# Patient Record
Sex: Female | Born: 1940 | Race: White | Hispanic: No | Smoking: Current every day smoker
Health system: Southern US, Community
[De-identification: ages and names within clinical notes are randomized; demographics above are authoritative.]

## PROBLEM LIST (undated history)

## (undated) DIAGNOSIS — F17200 Nicotine dependence, unspecified, uncomplicated: Secondary | ICD-10-CM

## (undated) DIAGNOSIS — K219 Gastro-esophageal reflux disease without esophagitis: Secondary | ICD-10-CM

## (undated) DIAGNOSIS — N182 Chronic kidney disease, stage 2 (mild): Secondary | ICD-10-CM

## (undated) DIAGNOSIS — R740 Nonspecific elevation of levels of transaminase and lactic acid dehydrogenase [LDH]: Secondary | ICD-10-CM

## (undated) DIAGNOSIS — E039 Hypothyroidism, unspecified: Secondary | ICD-10-CM

## (undated) DIAGNOSIS — Z8619 Personal history of other infectious and parasitic diseases: Secondary | ICD-10-CM

## (undated) DIAGNOSIS — J309 Allergic rhinitis, unspecified: Secondary | ICD-10-CM

## (undated) DIAGNOSIS — K760 Fatty (change of) liver, not elsewhere classified: Secondary | ICD-10-CM

## (undated) DIAGNOSIS — M810 Age-related osteoporosis without current pathological fracture: Secondary | ICD-10-CM

## (undated) DIAGNOSIS — F419 Anxiety disorder, unspecified: Secondary | ICD-10-CM

## (undated) DIAGNOSIS — J449 Chronic obstructive pulmonary disease, unspecified: Secondary | ICD-10-CM

## (undated) HISTORY — DX: Gastro-esophageal reflux disease without esophagitis: K21.9

## (undated) HISTORY — DX: Chronic obstructive pulmonary disease, unspecified: J44.9

## (undated) HISTORY — DX: Chronic kidney disease, stage 2 (mild): N18.2

## (undated) HISTORY — DX: Nicotine dependence, unspecified, uncomplicated: F17.200

## (undated) HISTORY — DX: Age-related osteoporosis without current pathological fracture: M81.0

## (undated) HISTORY — DX: Anxiety disorder, unspecified: F41.9

## (undated) HISTORY — DX: Nonspecific elevation of levels of transaminase and lactic acid dehydrogenase (ldh): R74.0

## (undated) HISTORY — DX: Allergic rhinitis, unspecified: J30.9

## (undated) HISTORY — DX: Hypothyroidism, unspecified: E03.9

## (undated) HISTORY — DX: Personal history of other infectious and parasitic diseases: Z86.19

---

## 1998-06-21 ENCOUNTER — Ambulatory Visit (HOSPITAL_COMMUNITY): Admission: RE | Admit: 1998-06-21 | Discharge: 1998-06-21 | Payer: Self-pay | Admitting: Internal Medicine

## 1998-06-21 ENCOUNTER — Encounter: Payer: Self-pay | Admitting: Internal Medicine

## 1999-12-20 ENCOUNTER — Emergency Department (HOSPITAL_COMMUNITY): Admission: EM | Admit: 1999-12-20 | Discharge: 1999-12-20 | Payer: Self-pay | Admitting: Emergency Medicine

## 2000-01-22 ENCOUNTER — Encounter: Payer: Self-pay | Admitting: Internal Medicine

## 2000-01-22 ENCOUNTER — Ambulatory Visit (HOSPITAL_COMMUNITY): Admission: RE | Admit: 2000-01-22 | Discharge: 2000-01-22 | Payer: Self-pay | Admitting: Internal Medicine

## 2005-10-01 ENCOUNTER — Other Ambulatory Visit: Admission: RE | Admit: 2005-10-01 | Discharge: 2005-10-01 | Payer: Self-pay | Admitting: Internal Medicine

## 2010-05-08 ENCOUNTER — Other Ambulatory Visit: Payer: Self-pay | Admitting: Internal Medicine

## 2010-05-08 DIAGNOSIS — R1011 Right upper quadrant pain: Secondary | ICD-10-CM

## 2010-05-12 ENCOUNTER — Ambulatory Visit
Admission: RE | Admit: 2010-05-12 | Discharge: 2010-05-12 | Disposition: A | Discharge status: Home / Self Care | Payer: Medicare Other | Source: Ambulatory Visit | Attending: Internal Medicine | Admitting: Internal Medicine

## 2010-05-12 DIAGNOSIS — R1011 Right upper quadrant pain: Secondary | ICD-10-CM

## 2011-10-13 DIAGNOSIS — M810 Age-related osteoporosis without current pathological fracture: Secondary | ICD-10-CM

## 2011-10-13 HISTORY — DX: Age-related osteoporosis without current pathological fracture: M81.0

## 2013-12-06 ENCOUNTER — Other Ambulatory Visit: Payer: Self-pay | Admitting: Internal Medicine

## 2013-12-06 DIAGNOSIS — R748 Abnormal levels of other serum enzymes: Secondary | ICD-10-CM

## 2013-12-06 DIAGNOSIS — R1010 Upper abdominal pain, unspecified: Secondary | ICD-10-CM

## 2013-12-15 ENCOUNTER — Ambulatory Visit
Admission: RE | Admit: 2013-12-15 | Discharge: 2013-12-15 | Disposition: A | Discharge status: Home / Self Care | Payer: Medicare Other | Source: Ambulatory Visit | Attending: Internal Medicine | Admitting: Internal Medicine

## 2013-12-15 ENCOUNTER — Encounter (INDEPENDENT_AMBULATORY_CARE_PROVIDER_SITE_OTHER): Payer: Self-pay

## 2013-12-15 DIAGNOSIS — R748 Abnormal levels of other serum enzymes: Secondary | ICD-10-CM

## 2013-12-15 DIAGNOSIS — R1010 Upper abdominal pain, unspecified: Secondary | ICD-10-CM

## 2014-03-26 DIAGNOSIS — J441 Chronic obstructive pulmonary disease with (acute) exacerbation: Secondary | ICD-10-CM | POA: Diagnosis not present

## 2014-03-26 DIAGNOSIS — Z720 Tobacco use: Secondary | ICD-10-CM | POA: Diagnosis not present

## 2014-03-26 DIAGNOSIS — J309 Allergic rhinitis, unspecified: Secondary | ICD-10-CM | POA: Diagnosis not present

## 2015-02-13 DIAGNOSIS — R7401 Elevation of levels of liver transaminase levels: Secondary | ICD-10-CM

## 2015-02-13 HISTORY — DX: Elevation of levels of liver transaminase levels: R74.01

## 2015-02-22 ENCOUNTER — Encounter: Payer: Self-pay | Admitting: Family Medicine

## 2015-02-22 ENCOUNTER — Ambulatory Visit (INDEPENDENT_AMBULATORY_CARE_PROVIDER_SITE_OTHER): Payer: Medicare Other | Admitting: Family Medicine

## 2015-02-22 VITALS — BP 99/65 | HR 95 | Temp 97.4°F | Resp 16 | Ht 63.0 in | Wt 104.5 lb

## 2015-02-22 DIAGNOSIS — Z23 Encounter for immunization: Secondary | ICD-10-CM | POA: Diagnosis not present

## 2015-02-22 DIAGNOSIS — H9193 Unspecified hearing loss, bilateral: Secondary | ICD-10-CM

## 2015-02-22 DIAGNOSIS — F17200 Nicotine dependence, unspecified, uncomplicated: Secondary | ICD-10-CM

## 2015-02-22 DIAGNOSIS — J438 Other emphysema: Secondary | ICD-10-CM

## 2015-02-22 DIAGNOSIS — E039 Hypothyroidism, unspecified: Secondary | ICD-10-CM

## 2015-02-22 DIAGNOSIS — H9313 Tinnitus, bilateral: Secondary | ICD-10-CM | POA: Diagnosis not present

## 2015-02-22 DIAGNOSIS — Z0000 Encounter for general adult medical examination without abnormal findings: Secondary | ICD-10-CM

## 2015-02-22 LAB — COMPREHENSIVE METABOLIC PANEL
ALBUMIN: 4.3 g/dL (ref 3.5–5.2)
ALK PHOS: 264 U/L — AB (ref 39–117)
ALT: 53 U/L — AB (ref 0–35)
AST: 54 U/L — AB (ref 0–37)
BUN: 23 mg/dL (ref 6–23)
CHLORIDE: 104 meq/L (ref 96–112)
CO2: 26 mEq/L (ref 19–32)
CREATININE: 0.85 mg/dL (ref 0.40–1.20)
Calcium: 10.9 mg/dL — ABNORMAL HIGH (ref 8.4–10.5)
GFR: 69.44 mL/min (ref >60.00)
Glucose, Bld: 91 mg/dL (ref 70–99)
Potassium: 5.6 mEq/L — ABNORMAL HIGH (ref 3.5–5.1)
SODIUM: 138 meq/L (ref 135–145)
TOTAL PROTEIN: 7.7 g/dL (ref 6.0–8.3)
Total Bilirubin: 0.8 mg/dL (ref 0.2–1.2)

## 2015-02-22 LAB — TSH: TSH: 2.48 u[IU]/mL (ref 0.35–4.50)

## 2015-02-22 NOTE — Progress Notes (Signed)
Office Note 02/23/2015  CC:  Chief Complaint  Patient presents with  . Establish Care    Pt is not fasting.    HPI:  Rebecca Mckee is a 75 y.o. White female who is here to establish care. Patient's most recent primary MD: [facility] (Dr. Ricki Miller). Old records were not reviewed prior to or during today's visit.  Most recent MD f/u was spring 2016--"my allergies and a little pneumonia". Last labs unknown but she knows she is overdue for thyroid monitoring.   She takes levothyroxine correctly every day.  C/o long hx (about 4 yrs) of intermittent ringing in ears and hearing loss bilat.  Worse the last week or so.  No hx of excessive noise exposure.   Has never had audiology evaluation per her report.  No old records available for review today.  Has never had a colonoscopy and says she does not want one.  Says she has not been getting pap/pelvics b/c she has chosen to decline these.  Past Medical History  Diagnosis Date  . Allergic rhinitis   . Anxiety   . Hypothyroidism   . Tobacco dependence     History reviewed. No pertinent past surgical history.  Family History  Problem Relation Age of Onset  . Lung cancer Mother     smoker  . Lung cancer Father   . Breast cancer Maternal Grandmother   . Cancer Maternal Grandmother   . Congestive Heart Failure Paternal Grandmother   . Heart disease Paternal Grandfather     Social History   Social History  . Marital Status: Divorced    Spouse Name: N/A  . Number of Children: N/A  . Years of Education: N/A   Occupational History  . Not on file.   Social History Main Topics  . Smoking status: Current Every Day Smoker -- 0.25 packs/day for 60 years    Types: Cigarettes  . Smokeless tobacco: Never Used  . Alcohol Use: Yes  . Drug Use: No  . Sexual Activity: Not on file   Other Topics Concern  . Not on file   Social History Narrative   Widow, one son.   Lives with sister in [place].   Occupation: retired Holiday representative.   Tobacco: 60 pack-yr hx, ongoing as of 02/22/15.   Rare alcohol.       Outpatient Encounter Prescriptions as of 02/22/2015  Medication Sig  . SYNTHROID 50 MCG tablet Take 1 tablet by mouth daily.   No facility-administered encounter medications on file as of 02/22/2015.  ProAir HFA: she says she doesn't use it "very much".  Not on File  ROS Review of Systems  Constitutional: Negative for fever and fatigue.  HENT: Negative for congestion and sore throat.   Eyes: Negative for visual disturbance.  Respiratory: Negative for cough.   Cardiovascular: Negative for chest pain.  Gastrointestinal: Negative for nausea and abdominal pain.  Genitourinary: Negative for dysuria.  Musculoskeletal: Negative for back pain and joint swelling.  Skin: Negative for rash.  Neurological: Negative for weakness and headaches.  Hematological: Negative for adenopathy.    PE; Blood pressure 99/65, pulse 95, temperature 97.4 F (36.3 C), temperature source Oral, resp. rate 16, height  (1.6 m), weight 104 lb 8 oz (47.401 kg), SpO2 92 %. Gen: Alert, well appearing.  Patient is oriented to person, place, time, and situation. ENT: Ears: EACs clear, normal epithelium.  TMs with good light reflex and landmarks bilaterally.  Eyes: no injection, icteris, swelling,  or exudate.  EOMI, PERRLA. Nose: no drainage or turbinate edema/swelling.  No injection or focal lesion.  Mouth: lips without lesion/swelling.  Oral mucosa pink and moist.  Dentition intact and without obvious caries or gingival swelling.  Oropharynx without erythema, exudate, or swelling.  Neck - No masses or thyromegaly or limitation in range of motion CV: RRR, no m/r/g.   LUNGS: CTA bilat, nonlabored resps, good aeration in all lung fields.  Mild prolongation of expiratory phase. EXT: no clubbing, cyanosis, or edema.   Pertinent labs:  None today  ASSESSMENT AND PLAN:   New pt; obtain old  records.  1) Hypothyroidism: stable per pt history given today. Due for TSH monitoring--drawn today.  2) COPD: she seems unlimited by this since she is largely sedentary.  For now the plan is to simply continue with prn albuterol HFA use--which pt says is infrequent. Ongoing tobacco abuse: encouraged complete cessation but she is not contemplating this currently.  3) Hearing impairment AU, chronic, with tinnitus: refer to audiology for detailed evaluation.  4) Prev health care: flu vaccine today. We'll await old records to verify other vaccines' status.  An After Visit Summary was printed and given to the patient.  Return in about 6 months (around 08/22/2015) for routine chronic illness f/u.

## 2015-02-22 NOTE — Progress Notes (Signed)
Pre visit review using our clinic review tool, if applicable. No additional management support is needed unless otherwise documented below in the visit note. 

## 2015-02-23 ENCOUNTER — Encounter: Payer: Self-pay | Admitting: Family Medicine

## 2015-02-25 ENCOUNTER — Other Ambulatory Visit: Payer: Medicare Other

## 2015-02-25 DIAGNOSIS — R7401 Elevation of levels of liver transaminase levels: Secondary | ICD-10-CM

## 2015-02-25 DIAGNOSIS — R740 Nonspecific elevation of levels of transaminase and lactic acid dehydrogenase [LDH]: Principal | ICD-10-CM

## 2015-02-26 ENCOUNTER — Other Ambulatory Visit: Payer: Self-pay | Admitting: Family Medicine

## 2015-02-26 ENCOUNTER — Other Ambulatory Visit: Payer: Self-pay | Admitting: *Deleted

## 2015-02-26 ENCOUNTER — Encounter: Payer: Self-pay | Admitting: Family Medicine

## 2015-02-26 DIAGNOSIS — R7401 Elevation of levels of liver transaminase levels: Secondary | ICD-10-CM

## 2015-02-26 DIAGNOSIS — Z13220 Encounter for screening for lipoid disorders: Secondary | ICD-10-CM

## 2015-02-26 DIAGNOSIS — R748 Abnormal levels of other serum enzymes: Secondary | ICD-10-CM

## 2015-02-26 DIAGNOSIS — R740 Nonspecific elevation of levels of transaminase and lactic acid dehydrogenase [LDH]: Principal | ICD-10-CM

## 2015-02-26 LAB — HEPATITIS B SURFACE ANTIGEN: HEP B S AG: NEGATIVE

## 2015-02-26 LAB — HEPATITIS C ANTIBODY: HCV AB: NEGATIVE

## 2015-03-04 ENCOUNTER — Ambulatory Visit (HOSPITAL_COMMUNITY): Payer: Medicare Other

## 2015-03-11 ENCOUNTER — Ambulatory Visit
Admission: RE | Admit: 2015-03-11 | Discharge: 2015-03-11 | Disposition: A | Discharge status: Home / Self Care | Payer: Medicare Other | Source: Ambulatory Visit | Attending: Family Medicine | Admitting: Family Medicine

## 2015-03-11 DIAGNOSIS — R740 Nonspecific elevation of levels of transaminase and lactic acid dehydrogenase [LDH]: Principal | ICD-10-CM

## 2015-03-11 DIAGNOSIS — R748 Abnormal levels of other serum enzymes: Secondary | ICD-10-CM

## 2015-03-11 DIAGNOSIS — R7401 Elevation of levels of liver transaminase levels: Secondary | ICD-10-CM

## 2015-03-11 HISTORY — DX: Fatty (change of) liver, not elsewhere classified: K76.0

## 2015-03-18 ENCOUNTER — Ambulatory Visit (INDEPENDENT_AMBULATORY_CARE_PROVIDER_SITE_OTHER): Payer: Medicare Other | Admitting: Family Medicine

## 2015-03-18 ENCOUNTER — Ambulatory Visit (HOSPITAL_BASED_OUTPATIENT_CLINIC_OR_DEPARTMENT_OTHER)
Admission: RE | Admit: 2015-03-18 | Discharge: 2015-03-18 | Disposition: A | Discharge status: Home / Self Care | Payer: Medicare Other | Source: Ambulatory Visit | Attending: Family Medicine | Admitting: Family Medicine

## 2015-03-18 ENCOUNTER — Encounter: Payer: Self-pay | Admitting: Family Medicine

## 2015-03-18 VITALS — BP 104/71 | HR 104 | Temp 97.8°F | Resp 20 | Wt 99.0 lb

## 2015-03-18 DIAGNOSIS — J209 Acute bronchitis, unspecified: Secondary | ICD-10-CM

## 2015-03-18 DIAGNOSIS — J449 Chronic obstructive pulmonary disease, unspecified: Secondary | ICD-10-CM | POA: Diagnosis not present

## 2015-03-18 DIAGNOSIS — R509 Fever, unspecified: Secondary | ICD-10-CM | POA: Diagnosis present

## 2015-03-18 MED ORDER — DOXYCYCLINE HYCLATE 100 MG PO TABS: 100.0000 mg | ORAL_TABLET | Freq: Two times a day (BID) | ORAL | Status: DC

## 2015-03-18 MED ORDER — PREDNISONE 50 MG PO TABS: ORAL_TABLET | ORAL | Status: DC

## 2015-03-18 NOTE — Patient Instructions (Signed)
[facility]--> CXR [address] (right off of [address]) after chop house before pallidium.  Rest and hydrate Floanse. Mucinex pick up over the counter.  Doxycyline and Prednisone called in to pharmacy.    Acute Bronchitis Bronchitis is inflammation of the airways that extend from the windpipe into the lungs (bronchi). The inflammation often causes mucus to develop. This leads to a cough, which is the most common symptom of bronchitis.  In acute bronchitis, the condition usually develops suddenly and goes away over time, usually in a couple weeks. Smoking, allergies, and asthma can make bronchitis worse. Repeated episodes of bronchitis may cause further lung problems.  CAUSES Acute bronchitis is most often caused by the same virus that causes a cold. The virus can spread from person to person (contagious) through coughing, sneezing, and touching contaminated objects. SIGNS AND SYMPTOMS   Cough.   Fever.   Coughing up mucus.   Body aches.   Chest congestion.   Chills.   Shortness of breath.   Sore throat.  DIAGNOSIS  Acute bronchitis is usually diagnosed through a physical exam. Your health care provider will also ask you questions about your medical history. Tests, such as chest X-rays, are sometimes done to rule out other conditions.  TREATMENT  Acute bronchitis usually goes away in a couple weeks. Oftentimes, no medical treatment is necessary. Medicines are sometimes given for relief of fever or cough. Antibiotic medicines are usually not needed but may be prescribed in certain situations. In some cases, an inhaler may be recommended to help reduce shortness of breath and control the cough. A cool mist vaporizer may also be used to help thin bronchial secretions and make it easier to clear the chest.  HOME CARE INSTRUCTIONS  Get plenty of rest.   Drink enough fluids to keep your urine clear or pale yellow (unless you have a medical condition that requires fluid  restriction). Increasing fluids may help thin your respiratory secretions (sputum) and reduce chest congestion, and it will prevent dehydration.   Take medicines only as directed by your health care provider.  If you were prescribed an antibiotic medicine, finish it all even if you start to feel better.  Avoid smoking and secondhand smoke. Exposure to cigarette smoke or irritating chemicals will make bronchitis worse. If you are a smoker, consider using nicotine gum or skin patches to help control withdrawal symptoms. Quitting smoking will help your lungs heal faster.   Reduce the chances of another bout of acute bronchitis by washing your hands frequently, avoiding people with cold symptoms, and trying not to touch your hands to your mouth, nose, or eyes.   Keep all follow-up visits as directed by your health care provider.  SEEK MEDICAL CARE IF: Your symptoms do not improve after 1 week of treatment.  SEEK IMMEDIATE MEDICAL CARE IF:  You develop an increased fever or chills.   You have chest pain.   You have severe shortness of breath.  You have bloody sputum.   You develop dehydration.  You faint or repeatedly feel like you are going to pass out.  You develop repeated vomiting.  You develop a severe headache. MAKE SURE YOU:   Understand these instructions.  Will watch your condition.  Will get help right away if you are not doing well or get worse.   This information is not intended to replace advice given to you by your health care provider. Make sure you discuss any questions you have with your health  care provider.   Document Released: 02/06/2004 Document Revised: 01/19/2014 Document Reviewed: 06/21/2012 Elsevier Interactive Patient Education [employer].

## 2015-03-18 NOTE — Progress Notes (Signed)
Patient ID: Rebecca Mckee, female   DOB: 1941/01/09, 75 y.o.   MRN: [id number]    Rebecca Mckee , 09-01-1940, 75 y.o., female MRN: [id number]  CC: Dizziness Subjective: Pt presents for an acute OV with complaints of dizziness of 5 days duration. Associated symptoms include dizziness, nausea, low grade fever,chills, fatigue, PND, cough.  Pt has tried sudafed (last dose yesterday), mucinex x2,  to ease their symptoms. Pt has been recently diagnose with Fatty liver and elevated LFT, negtaive hepatits panel and [place] confirmed.  Normal GFR >60.  Smoker/COPD--> has albuterol. Likely needs PFT.  Allergic to PCN. No vomit, diarrhea, abdominal pain.  Allergies  Allergen Reactions  . Penicillins Other (See Comments)    Childhood unknown reaction   Social History  Substance Use Topics  . Smoking status: Current Every Day Smoker -- 0.25 packs/day for 60 years    Types: Cigarettes  . Smokeless tobacco: Never Used  . Alcohol Use: Yes   Past Medical History  Diagnosis Date  . Allergic rhinitis   . Anxiety   . Hypothyroidism   . Tobacco dependence     ongoing as of 02/2015  . COPD (chronic obstructive pulmonary disease) ([place])   . Elevated transaminase level 02/2015    + elevated alk phos.  Hep B and Hep C screening neg.  . Fatty liver     MILD elevation of LFTs 02/2015    History reviewed. No pertinent past surgical history. Family History  Problem Relation Age of Onset  . Lung cancer Mother     smoker  . Lung cancer Father   . Breast cancer Maternal Grandmother   . Cancer Maternal Grandmother   . Congestive Heart Failure Paternal Grandmother   . Heart disease Paternal Grandfather      Medication List       This list is accurate as of: 03/18/15  4:00 PM.  Always use your most recent med list.               SYNTHROID 50 MCG tablet  Generic drug:  levothyroxine  Take 1 tablet by mouth daily.         ROS: Negative, with the exception of above mentioned in  HPI   Objective:  BP 104/71 mmHg  Pulse 104  Temp(Src) 97.8 F (36.6 C)  Resp 20  Wt 99 lb (44.906 kg)  SpO2 93% Body mass index is 17.54 kg/(m^2). Gen: Afebrile. No acute distress. Nontoxic in appearance. Very thin female.  HENT: AT. . Bilateral TM visualized and normal in appearance. MMM, no oral lesions. Bilateral nares with erythema and swelling. Throat without erythema or exudates. Cough and hoarseness on exam.  Eyes:Pupils Equal Round Reactive to light, Extraocular movements intact,  Conjunctiva without redness, discharge or icterus. Neck/lymp/endocrine: Supple, no lymphadenopathy CV: mild tachycardia. No murmur appreciated.  Chest: CTAB, no wheeze or crackles. diminshed breath sounds bilateral. normal resp effort.  Abd: Soft. NTND. BS present  Skin: No  rashes, purpura or petechiae.  Neuro: Normal gait. PERLA. EOMi. Alert. Oriented x3    Assessment/Plan: Hisayo Delossantos is a 75 y.o. female present for acute OV for  1. Acute bronchitis, unspecified organism - doxycycline (VIBRA-TABS) 100 MG tablet; Take 1 tablet (100 mg total) by mouth 2 (two) times daily.  Dispense: 20 tablet; Refill: 0 - predniSONE (DELTASONE) 50 MG tablet; 50 mg QD.  Dispense: 5 tablet; Refill: 0 - DG Chest 2 View; Future - [facility]--> CXR - Rest and hydrate - Floanse. Mucinex, Doxycyline,  Prednisone  - F/U depending on cxr  electronically signed by:  Howard Pouch, DO  [place]

## 2015-03-19 ENCOUNTER — Telehealth: Payer: Self-pay | Admitting: Family Medicine

## 2015-03-19 DIAGNOSIS — IMO0001 Reserved for inherently not codable concepts without codable children: Secondary | ICD-10-CM

## 2015-03-19 NOTE — Telephone Encounter (Signed)
Please call pt: - Her xray is negative for pneumonia. It did show some changes consistent with COPD. I have placed a pulmonology referral for her to set up once acute illness is resolved.

## 2015-03-19 NOTE — Telephone Encounter (Signed)
Spoke with patient sister reviewed results and information.

## 2015-03-19 NOTE — Telephone Encounter (Signed)
Left message for patient to return call to review results ann information.

## 2015-03-20 ENCOUNTER — Telehealth: Payer: Self-pay | Admitting: *Deleted

## 2015-03-20 MED ORDER — AZITHROMYCIN 250 MG PO TABS: ORAL_TABLET | ORAL | Status: DC

## 2015-03-20 NOTE — Telephone Encounter (Signed)
Patient called states she had reaction to Doxycycline states she turned red, itchy and difficulty swallowing. Patient instructed to discontinue Doxycycline. Ok to take Benadryl as needed for itching. Patient states symptoms resolved at this time. Added doxycycline to patient allergy list. Per Dr Claiborne BillingsKuneff Zpak called in for patient. Patient instructed if Sx reoccur go to ER. Patient verbalized understanding.

## 2015-04-02 ENCOUNTER — Encounter: Payer: Self-pay | Admitting: Family Medicine

## 2015-04-18 ENCOUNTER — Encounter (INDEPENDENT_AMBULATORY_CARE_PROVIDER_SITE_OTHER): Payer: Self-pay

## 2015-04-18 ENCOUNTER — Ambulatory Visit (INDEPENDENT_AMBULATORY_CARE_PROVIDER_SITE_OTHER): Payer: Medicare Other | Admitting: Pulmonary Disease

## 2015-04-18 ENCOUNTER — Encounter: Payer: Self-pay | Admitting: Pulmonary Disease

## 2015-04-18 VITALS — BP 126/84 | HR 115 | Ht 64.0 in | Wt 98.0 lb

## 2015-04-18 DIAGNOSIS — J439 Emphysema, unspecified: Secondary | ICD-10-CM

## 2015-04-18 NOTE — Patient Instructions (Signed)
We will give you a sample of Bevespi  be used twice a day.  We will schedule you for PFTs  Return to clinic in 1 month

## 2015-04-18 NOTE — Progress Notes (Signed)
   Subjective:    Patient ID: Rebecca Mckee, female    DOB: 09/10/1940, 75 y.o.   MRN: [id number]  HPI Consult for evaluation of COPD.  Mr. Viviana Simplerissillos is a 75 year old with past medical history of hypothyroidism. She was evaluated last month for weakness, dyspnea on exertion, wheezing. She had a chest x-ray didn't which did not show any acute infiltrate but had hyperinflation consistent with COPD. She was diagnosed with bronchitis, COPD exacerbation and was given a course of prednisone and antibiotic. She feels improved although she still has dyspnea on exertion. She has albuterol rescue inhaler that he she uses sparingly. She does not notice any improvement with this.  DATA: CXR 03/18/15 Imaging reviewed. Hyperinflation consistent with COPD.                  Social History: 5 cigarettes per day smoker for nearly 60 years. She continues to smoke. No alcohol, drug use. 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   Family History:     Father-lung cancer Mother-lung cancer                                                                                                                                                        Review of Systems Has dyspnea exertion, cough with mucus production. Denies any wheezing, hemoptysis. Denies any chest pain, palpitation. Denies any nausea, vomiting, diarrhea, constipation. Denies any fevers, chills, loss of weight, loss of appetite. All other review of systems are negative.    Objective:   Physical Exam Blood pressure 126/84, pulse 115, height 5\' 4"  (1.626 m), weight 98 lb (44.453 kg), SpO2 95 %. Gen: No apparent distress Neuro: No gross focal deficits. HEENT: No JVD, lymphadenopathy, thyromegaly. RS: Clear, No wheeze or crackles CVS: S1-S2 heard, no murmurs rubs gallops. Abdomen: Soft, positive bowel sounds. Musculoskeletal: No edema.    Assessment & Plan:  Dyspnea Active smoker. She likely has COPD based on her smoking history and  imaging which shows hyperinflation but she seems a little resistant to accept that diagnosis. She is just on albuterol rescue inhaler. I'll start her on Bevespi  for relief of her dyspnea on exertion and send her for pulmonary function tests to evaluate the degree of obstruction.   She is not interested in quitting just yet. She may be a candidate for lung cancer screening. I will address this at next visit.  Plan: - PFTs - Start Bevespi, continue albuterol rescue inhaler.  Chilton GreathousePraveen Trevonte Ashkar MD [facility] Pager [phone] If no answer or after 3pm call: [phone] 04/18/2015, 3:10 PM

## 2015-05-08 ENCOUNTER — Encounter: Payer: Self-pay | Admitting: Family Medicine

## 2015-05-16 ENCOUNTER — Encounter (HOSPITAL_COMMUNITY): Payer: Medicare Other

## 2015-05-16 ENCOUNTER — Ambulatory Visit: Payer: Medicare Other | Admitting: Pulmonary Disease

## 2015-05-20 ENCOUNTER — Encounter: Payer: Self-pay | Admitting: Family Medicine

## 2015-08-22 ENCOUNTER — Ambulatory Visit: Payer: Self-pay | Admitting: Family Medicine

## 2015-08-22 DIAGNOSIS — Z0289 Encounter for other administrative examinations: Secondary | ICD-10-CM

## 2015-09-09 ENCOUNTER — Telehealth: Payer: Self-pay | Admitting: Family Medicine

## 2015-09-09 NOTE — Telephone Encounter (Signed)
Please advise 

## 2015-09-09 NOTE — Telephone Encounter (Signed)
Patient has made an appt for tomorrow but wants to know if there is anything she can do tonight to help her sleep. She is up all night, thinks maybe her thyroid is "out of wack".

## 2015-09-09 NOTE — Telephone Encounter (Signed)
Tried contacting patient.  No answer.  No VM to leave message.

## 2015-09-09 NOTE — Telephone Encounter (Signed)
Benadryl 25-50 mg about 1 hour prior to bedtime.

## 2015-09-10 ENCOUNTER — Encounter: Payer: Self-pay | Admitting: Family Medicine

## 2015-09-10 ENCOUNTER — Ambulatory Visit (INDEPENDENT_AMBULATORY_CARE_PROVIDER_SITE_OTHER): Payer: Medicare Other | Admitting: Family Medicine

## 2015-09-10 VITALS — BP 108/70 | HR 95 | Temp 97.7°F | Resp 16 | Ht 63.0 in | Wt 100.8 lb

## 2015-09-10 DIAGNOSIS — Z8639 Personal history of other endocrine, nutritional and metabolic disease: Secondary | ICD-10-CM

## 2015-09-10 DIAGNOSIS — G4700 Insomnia, unspecified: Secondary | ICD-10-CM | POA: Diagnosis not present

## 2015-09-10 DIAGNOSIS — E039 Hypothyroidism, unspecified: Secondary | ICD-10-CM | POA: Diagnosis not present

## 2015-09-10 DIAGNOSIS — K760 Fatty (change of) liver, not elsewhere classified: Secondary | ICD-10-CM

## 2015-09-10 DIAGNOSIS — L659 Nonscarring hair loss, unspecified: Secondary | ICD-10-CM | POA: Diagnosis not present

## 2015-09-10 DIAGNOSIS — J438 Other emphysema: Secondary | ICD-10-CM

## 2015-09-10 LAB — COMPREHENSIVE METABOLIC PANEL
ALT: 59 U/L — AB (ref 0–35)
AST: 60 U/L — AB (ref 0–37)
Albumin: 4.2 g/dL (ref 3.5–5.2)
Alkaline Phosphatase: 386 U/L — ABNORMAL HIGH (ref 39–117)
BILIRUBIN TOTAL: 0.8 mg/dL (ref 0.2–1.2)
BUN: 20 mg/dL (ref 6–23)
CHLORIDE: 103 meq/L (ref 96–112)
CO2: 28 meq/L (ref 19–32)
Calcium: 9.6 mg/dL (ref 8.4–10.5)
Creatinine, Ser: 0.76 mg/dL (ref 0.40–1.20)
GFR: 78.89 mL/min (ref >60.00)
Glucose, Bld: 90 mg/dL (ref 70–99)
Potassium: 4.8 mEq/L (ref 3.5–5.1)
Sodium: 138 mEq/L (ref 135–145)
TOTAL PROTEIN: 7.5 g/dL (ref 6.0–8.3)

## 2015-09-10 LAB — CBC WITH DIFFERENTIAL/PLATELET
BASOS ABS: 0 10*3/uL (ref 0.0–0.1)
Basophils Relative: 0.6 % (ref 0.0–3.0)
EOS PCT: 3 % (ref 0.0–5.0)
Eosinophils Absolute: 0.2 10*3/uL (ref 0.0–0.7)
HCT: 43.3 % (ref 36.0–46.0)
HEMOGLOBIN: 14.5 g/dL (ref 12.0–15.0)
LYMPHS ABS: 2.1 10*3/uL (ref 0.7–4.0)
Lymphocytes Relative: 30.5 % (ref 12.0–46.0)
MCHC: 33.5 g/dL (ref 30.0–36.0)
MCV: 91 fl (ref 78.0–100.0)
MONO ABS: 0.6 10*3/uL (ref 0.1–1.0)
MONOS PCT: 8.8 % (ref 3.0–12.0)
NEUTROS PCT: 57.1 % (ref 43.0–77.0)
Neutro Abs: 4 10*3/uL (ref 1.4–7.7)
Platelets: 249 10*3/uL (ref 150.0–400.0)
RBC: 4.75 Mil/uL (ref 3.87–5.11)
RDW: 13.5 % (ref 11.5–15.5)
WBC: 7 10*3/uL (ref 4.0–10.5)

## 2015-09-10 LAB — TSH: TSH: 3.16 u[IU]/mL (ref 0.35–4.50)

## 2015-09-10 LAB — IRON AND TIBC
%SAT: 28 % (ref 11–50)
IRON: 96 ug/dL (ref 45–160)
TIBC: 340 ug/dL (ref 250–450)
UIBC: 244 ug/dL (ref 125–400)

## 2015-09-10 LAB — FERRITIN: Ferritin: 111.9 ng/mL (ref 10.0–291.0)

## 2015-09-10 LAB — IRON: IRON: 96 ug/dL (ref 42–145)

## 2015-09-10 MED ORDER — ALBUTEROL SULFATE HFA 108 (90 BASE) MCG/ACT IN AERS: 2.0000 | INHALATION_SPRAY | Freq: Four times a day (QID) | RESPIRATORY_TRACT | 1 refills | Status: AC | PRN

## 2015-09-10 NOTE — Progress Notes (Signed)
OFFICE VISIT  09/10/2015   CC:  Chief Complaint  Patient presents with  . Insomnia   HPI:    Patient is a 75 y.o. Caucasian female who presents for insomnia.  Describes a recent episode of GERD that occurred at night, caused laryngospasm/SOB, that night she didn't sleep "at all".  Was also stressed that day--problems with son.  Didn't sleep the next night either.  Was able to sleep 6 hours last night w/out med. No RLS sx's.  She felt her mind would not shut down but denies this being much of a problem related to anxiety.  She doesn't take naps in daytime.  Her sister is with her today and says she is under a lot of stress. She wonders if her thyroid is off, though--having lots of hair loss and dry skin.  Denies blood in stool, urine, or vaginal bleeding. She is not on iron supplement.  She takes vit D and vit B12 supplement otc supplement sometimes.    Past Medical History:  Diagnosis Date  . Allergic rhinitis   . Anxiety   . Chronic renal insufficiency, stage II (mild)    GFR 60s  . COPD (chronic obstructive pulmonary disease) ([place])    +changes on CXR 2010 + smoker  . Elevated transaminase level 02/2015   + elevated alk phos.  Hep B and Hep C screening neg.  . Fatty liver    MILD elevation of LFTs 02/2015   . GERD (gastroesophageal reflux disease)   . History of Helicobacter pylori infection    treated  . Hypothyroidism   . Osteoporosis 10/2011   10/07/11 DEXA=osteoporosis  . Tobacco dependence    ongoing as of 02/2015    History reviewed. No pertinent surgical history.  Outpatient Medications Prior to Visit  Medication Sig Dispense Refill  . SYNTHROID 50 MCG tablet Take 1 tablet by mouth daily.  3   No facility-administered medications prior to visit.     Allergies  Allergen Reactions  . Doxycycline Itching    Difficulty swallowing  . Penicillins Other (See Comments)    Childhood unknown reaction    ROS As per HPI  PE: Blood pressure 108/70, pulse 95,  temperature 97.7 F (36.5 C), temperature source Oral, resp. rate 16, height '5\' 3"'  (1.6 m), weight 100 lb 12.8 oz (45.7 kg), SpO2 95 %. Gen: Alert, well appearing.  Patient is oriented to person, place, time, and situation. XUX:YBFX: no injection, icteris, swelling, or exudate.  EOMI, PERRLA. Mouth: lips without lesion/swelling.  Oral mucosa pink and moist. Oropharynx without erythema, exudate, or swelling.  CV: RRR, no m/r/g LUNGS: trace scattered soft exp wheeze, with markedly prolonged exp phase.  Nonlabored resps.  LABS:  Lab Results  Component Value Date   TSH 2.48 02/22/2015   Lab Results  Component Value Date   CREATININE 0.85 02/22/2015   BUN 23 02/22/2015   NA 138 02/22/2015   K 5.6 (H) 02/22/2015   CL 104 02/22/2015   CO2 26 02/22/2015   Lab Results  Component Value Date   ALT 53 (H) 02/22/2015   AST 54 (H) 02/22/2015   ALKPHOS 264 (H) 02/22/2015   BILITOT 0.8 02/22/2015   IMPRESSION AND PLAN:  1) Insomnia, suspected to be secondary to anxiety/stress. We'll check TSH today. Also, with c/o hair loss, will check CBC with iron labs.  2) Hx of fatty liver: we'll take this opportunity today to follow her hepatic panel.  3) CRI stage 2 (GFR in 60s), with hx  of mild hyperkalemia: we'll take this opportunity today to recheck electrolytes/cr.  4) COPD: Pt still smokes.  She saw pulm and refuses to take the med they rx'd b/c she read the package insert and is scared that the side effects are worse than having her disease.  I could not change her mind about this today.  An After Visit Summary was printed and given to the patient.  FOLLOW UP: Return in about 6 months (around 03/11/2016) for routine chronic illness f/u.  Signed:  Crissie Sickles, MD           09/10/2015

## 2015-09-10 NOTE — Telephone Encounter (Signed)
Pt being seen now

## 2016-01-15 IMAGING — US US ABDOMEN COMPLETE
1 series · 14 of 25 positions shown · non-contrast
Comparison: None.

CLINICAL DATA: Elevated alkaline phosphatase.

EXAM:
ULTRASOUND ABDOMEN COMPLETE

[Series 1: us abdomen complete · 0.21mm/px · 14 of 84 slices shown]
[im 1/84]
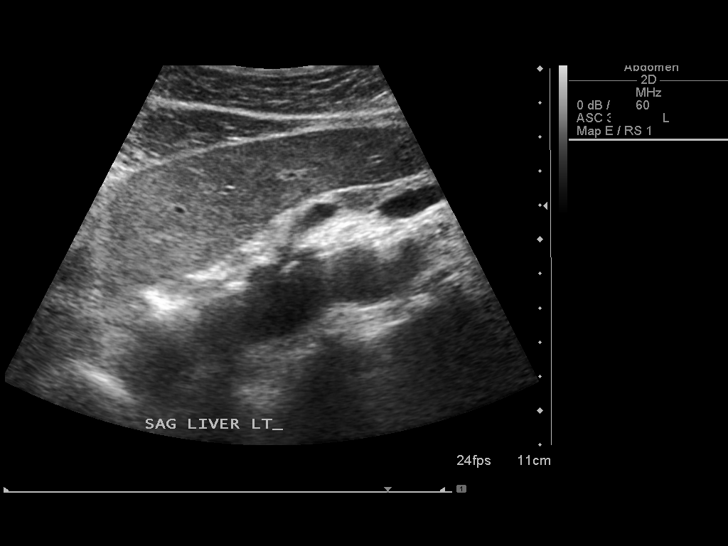
[im 7/84]
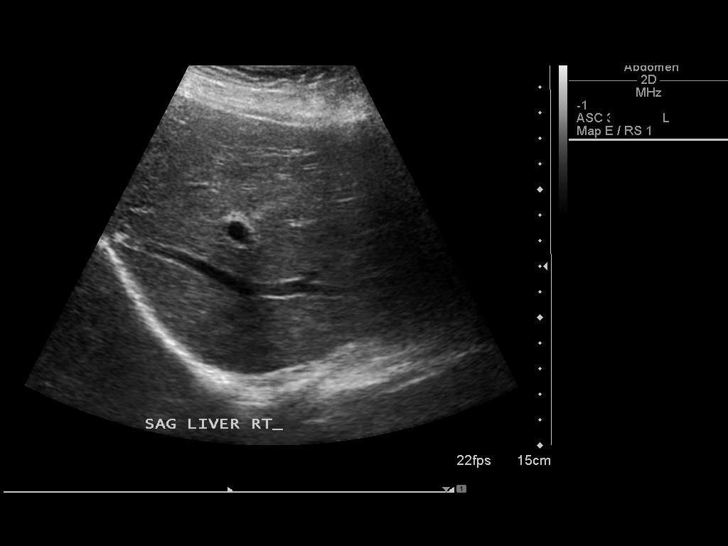
[im 14/84]
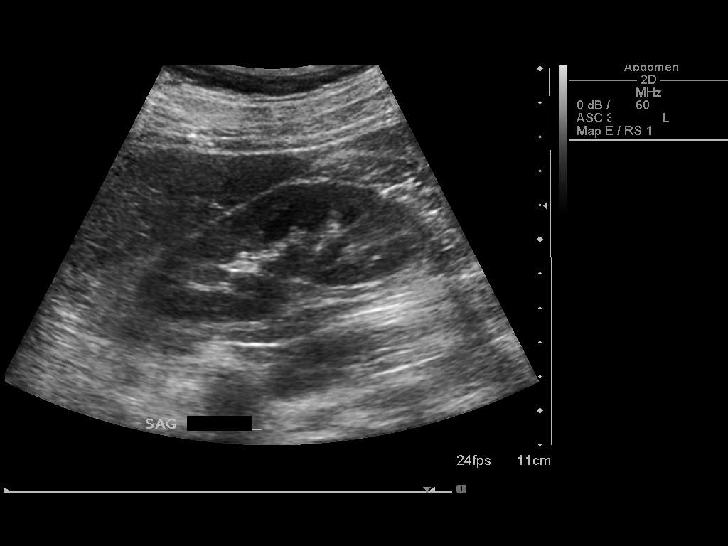
[im 21/84]
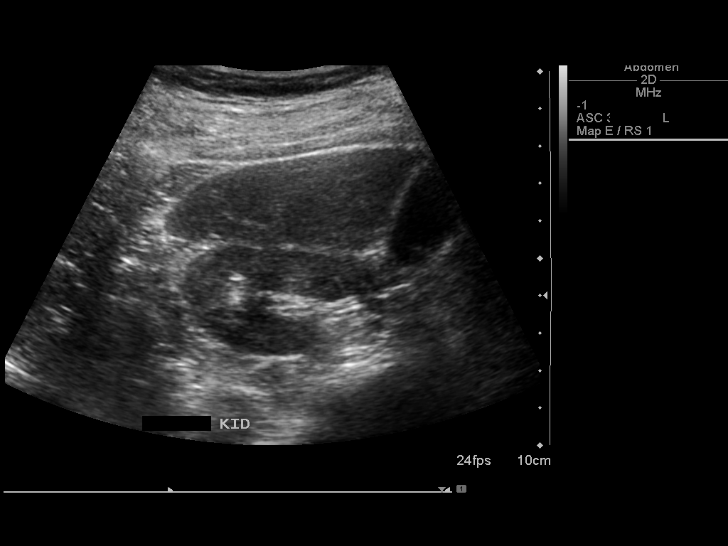
[im 28/84]
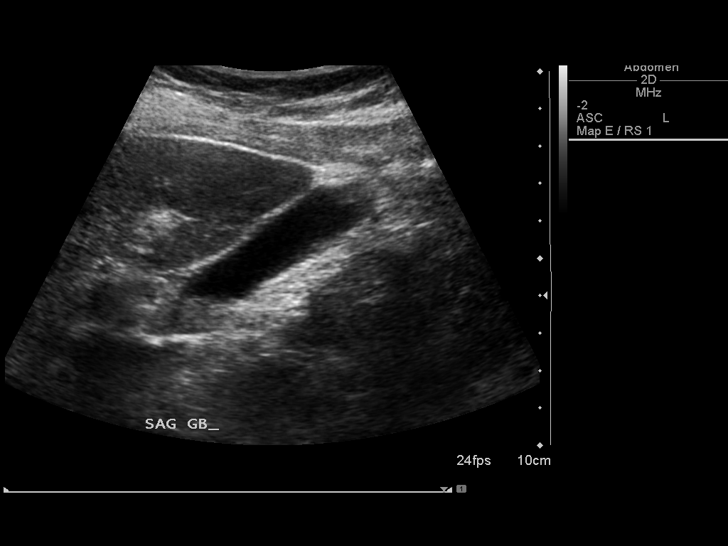
[im 32/84]
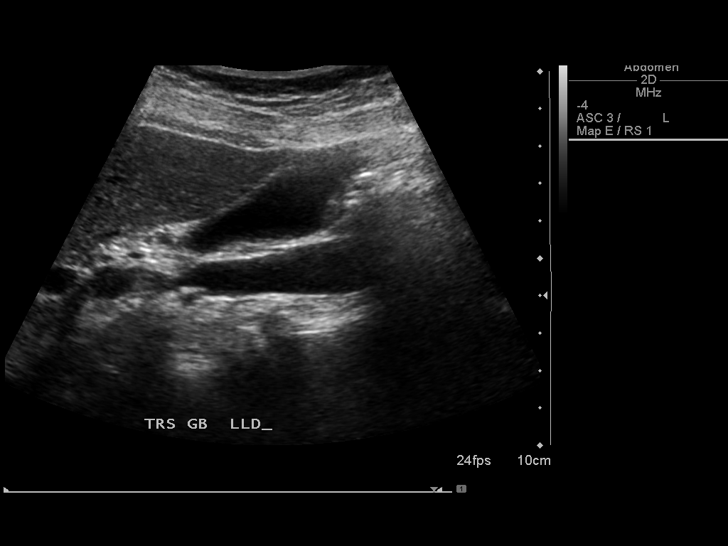
[im 39/84]
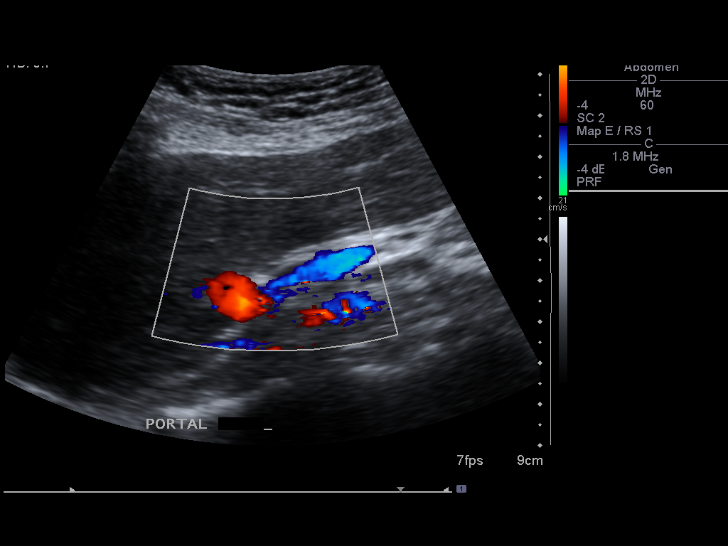
[im 45/84]
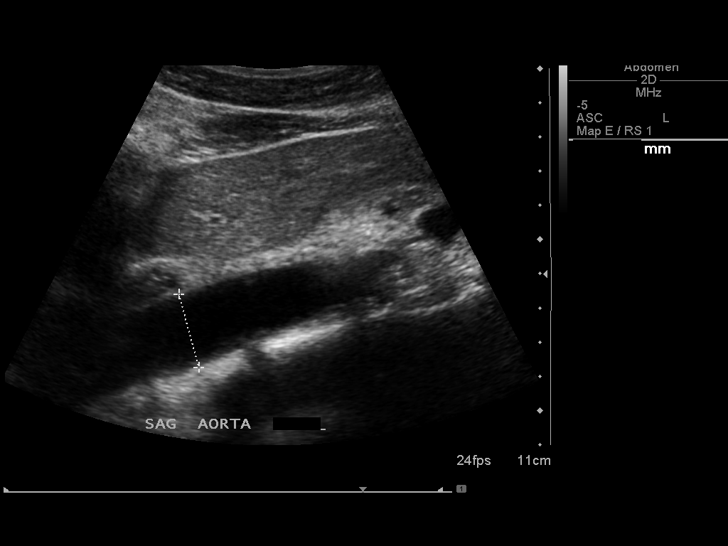
[im 52/84]
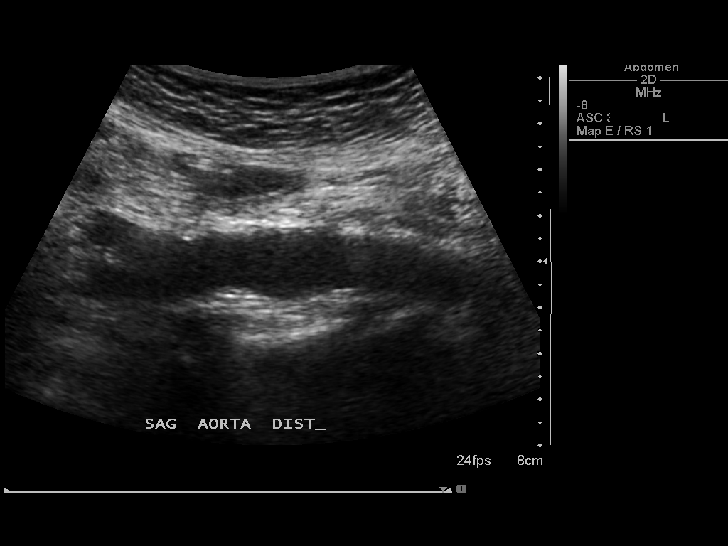
[im 56/84]
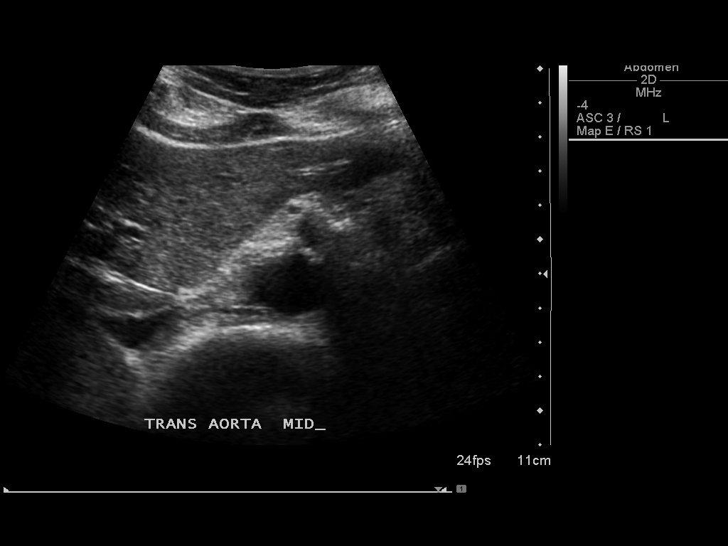
[im 63/84]
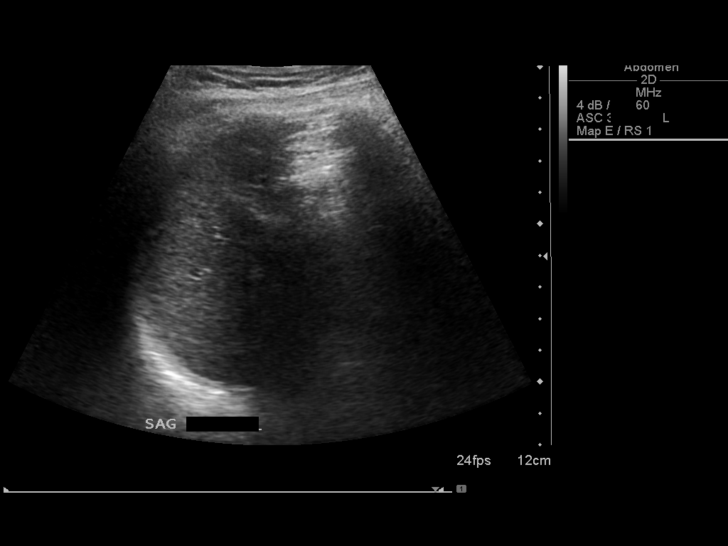
[im 70/84]
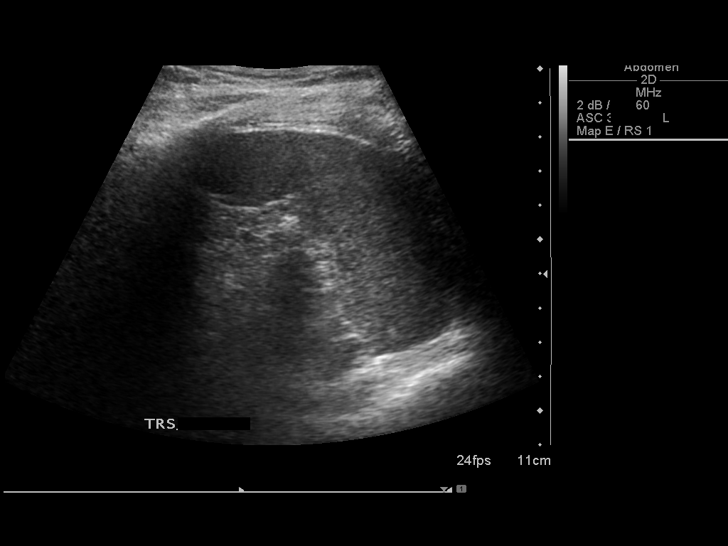
[im 77/84]
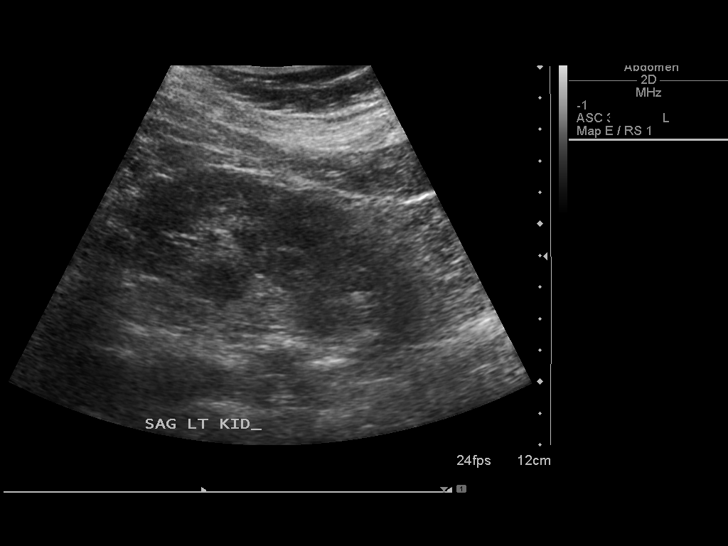
[im 84/84]
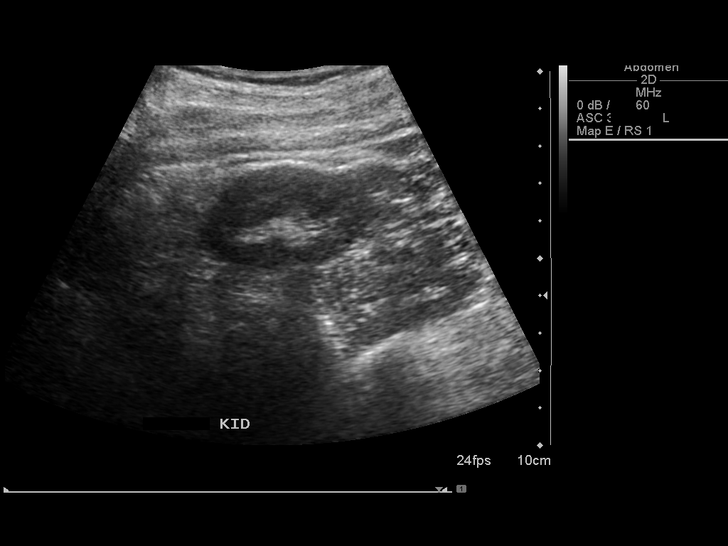

[14 of 25 positions shown; findings below may reference images not displayed]

FINDINGS: Gallbladder: No gallstones or wall thickening visualized. No
sonographic Murphy sign noted.

Common bile duct: Diameter: 3 mm

Liver: No focal lesion identified. Within normal limits in
parenchymal echogenicity.

IVC: No abnormality visualized.

Normal exam to

Pancreas: Visualized portion unremarkable.

Spleen: Size and appearance within normal limits.

Right Kidney: Length: 9.8 cm. Echogenicity within normal limits. No
mass or hydronephrosis visualized.

Left Kidney: Length: 11.0 cm. Echogenicity within normal limits. No
mass or hydronephrosis visualized.

Abdominal aorta: No aneurysm visualized.

Other findings: None.
IMPRESSION: Normal exam.

## 2016-11-17 ENCOUNTER — Ambulatory Visit (INDEPENDENT_AMBULATORY_CARE_PROVIDER_SITE_OTHER): Payer: Medicare Other

## 2016-11-17 DIAGNOSIS — Z23 Encounter for immunization: Secondary | ICD-10-CM

## 2017-04-10 IMAGING — US US ABDOMEN COMPLETE
1 series · 13 of 25 positions shown · non-contrast
Comparison: Abdominal ultrasound May 12, 2010 and renal
ultrasound December 15, 2013.

CLINICAL DATA: Elevated liver function studies associated with
increased gas and bloating sensation

EXAM:
ABDOMEN ULTRASOUND COMPLETE

[Series 1: us abdomen complete · 0.15mm/px · 13 of 81 slices shown]
[im 1/81]
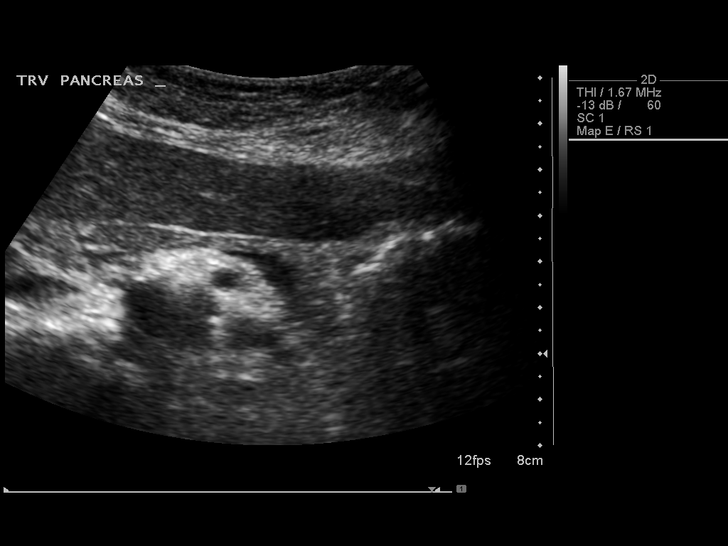
[im 7/81]
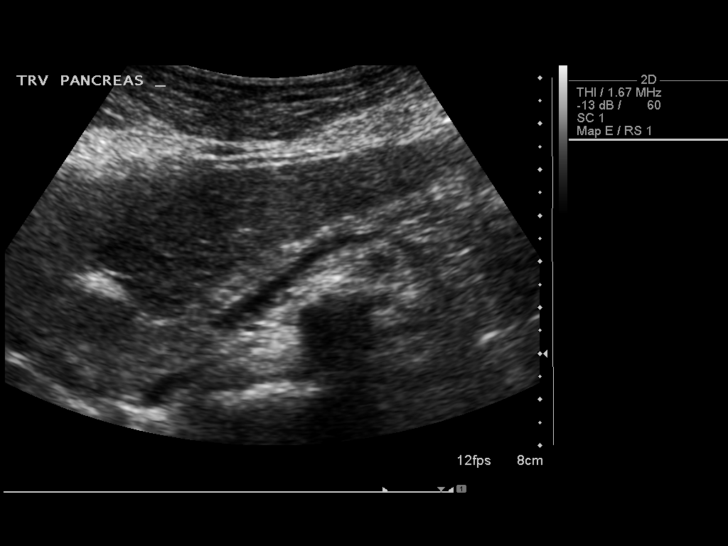
[im 14/81]
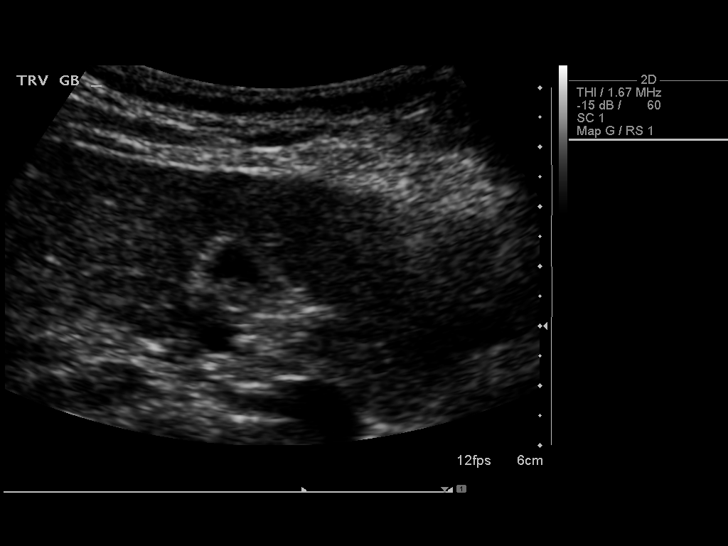
[im 21/81]
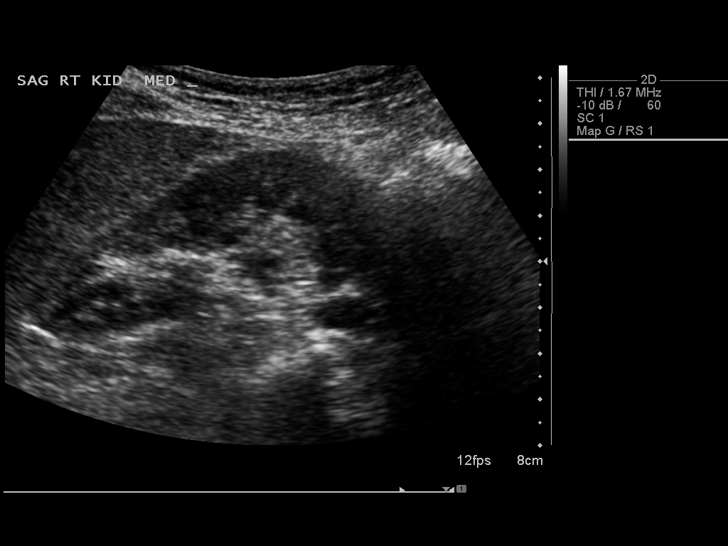
[im 27/81]
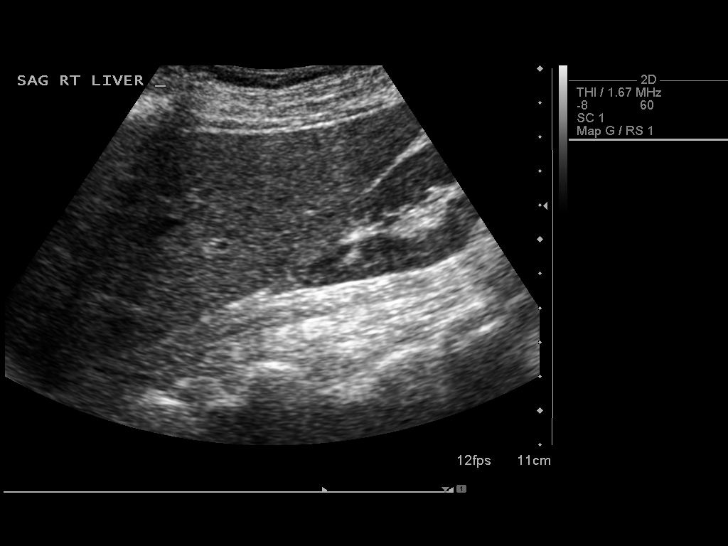
[im 34/81]
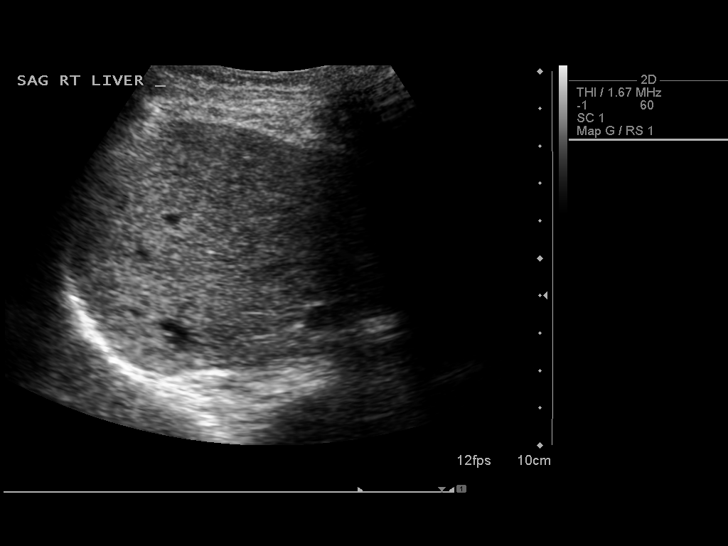
[im 41/81]
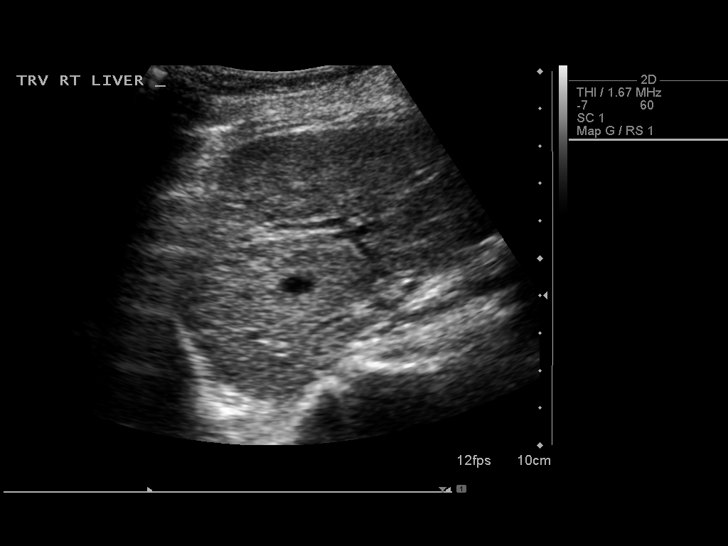
[im 47/81]
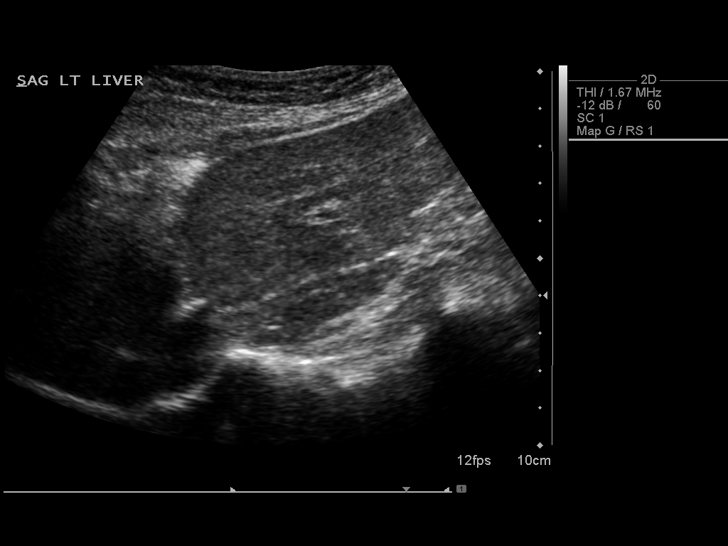
[im 54/81]
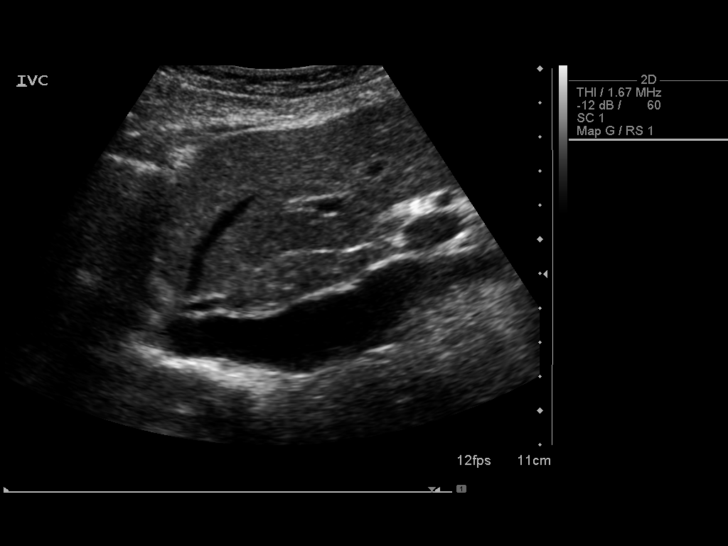
[im 61/81]
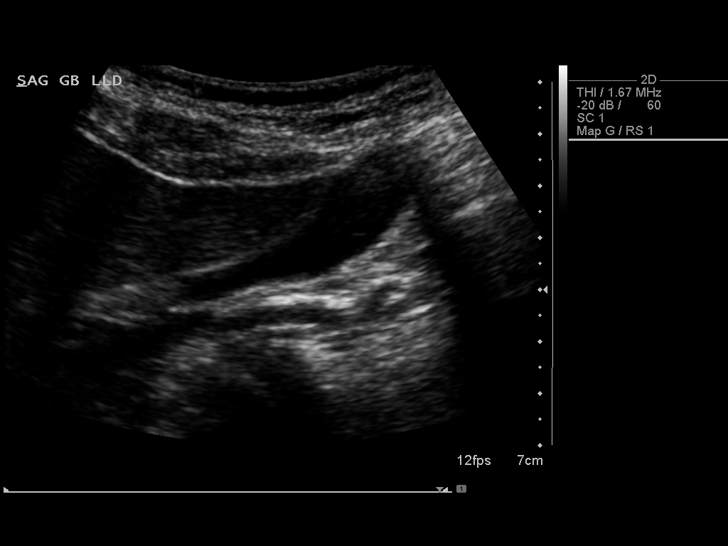
[im 67/81]
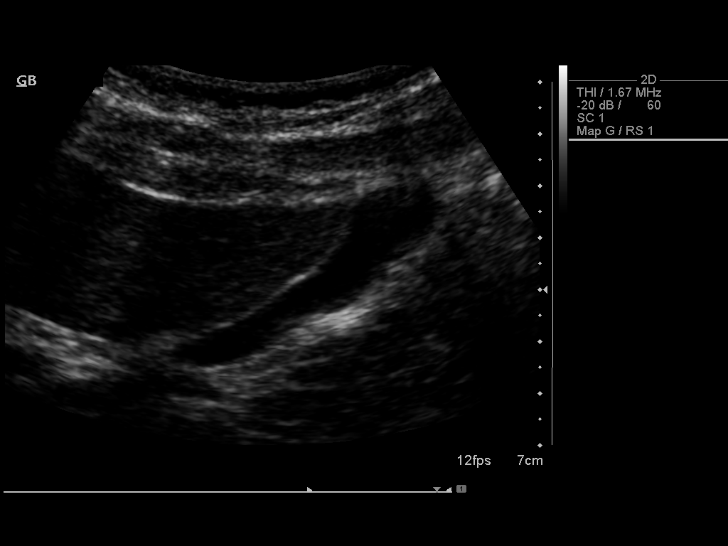
[im 74/81]
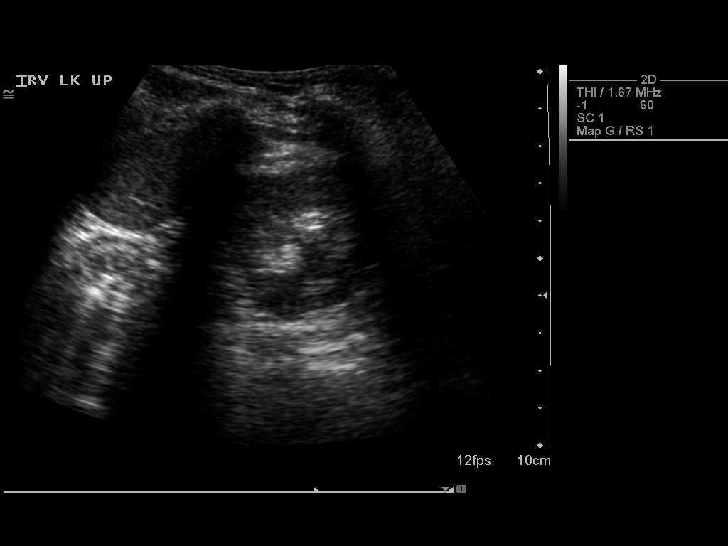
[im 81/81]
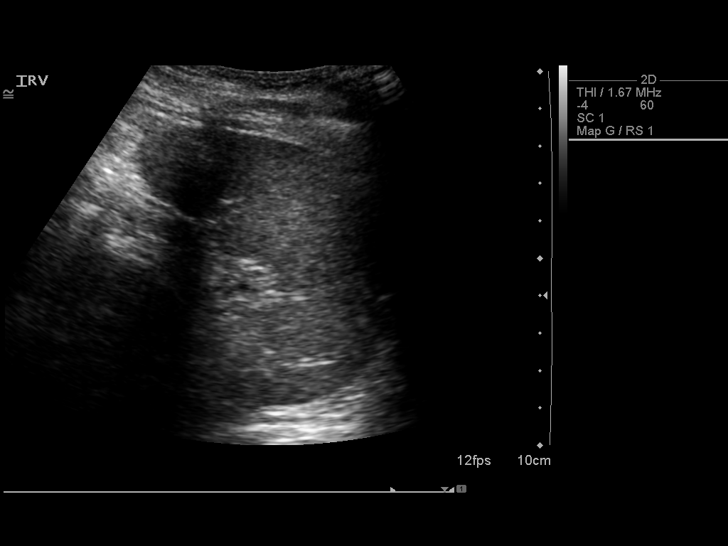

[13 of 25 positions shown; findings below may reference images not displayed]

FINDINGS: Gallbladder: No gallstones or wall thickening visualized. No
sonographic Murphy sign noted by sonographer.

Common bile duct: Diameter: 3.7 mm

Liver: The hepatic echotexture is somewhat heterogeneous. There is
no focal mass or ductal dilation. The surface contour of the liver
appears normal.

IVC: No abnormality visualized.

Pancreas: Visualized portion unremarkable.

Spleen: Size and appearance within normal limits.

Right Kidney: Length: 9.2 cm. Echogenicity within normal limits. No
mass or hydronephrosis visualized.

Left Kidney: Length: 10.4 cm. Echogenicity within normal limits. No
mass or hydronephrosis visualized.

Abdominal aorta: No aneurysm visualized.

Other findings: There is no ascites.
IMPRESSION: 1. No acute gallbladder abnormality is observed. If there are
clinical concerns of gallbladder dysfunction, a nuclear medicine
hepatobiliary scan may be useful.
2. Heterogeneous hepatic echotexture likely reflects fatty
infiltrative change. No acute hepatic abnormality is observed.
3. The remainder of the examination is within the limits of normal.

## 2017-04-17 IMAGING — DX DG CHEST 2V
2 series · 2 of 2 positions shown · non-contrast
Comparison: 09/06/2014

CLINICAL DATA: Dizzy nausea shortness of breath cough chills for 5
days, fever as well, history of COPD, patient smokes

EXAM:
CHEST  2 VIEW

[chest pa]
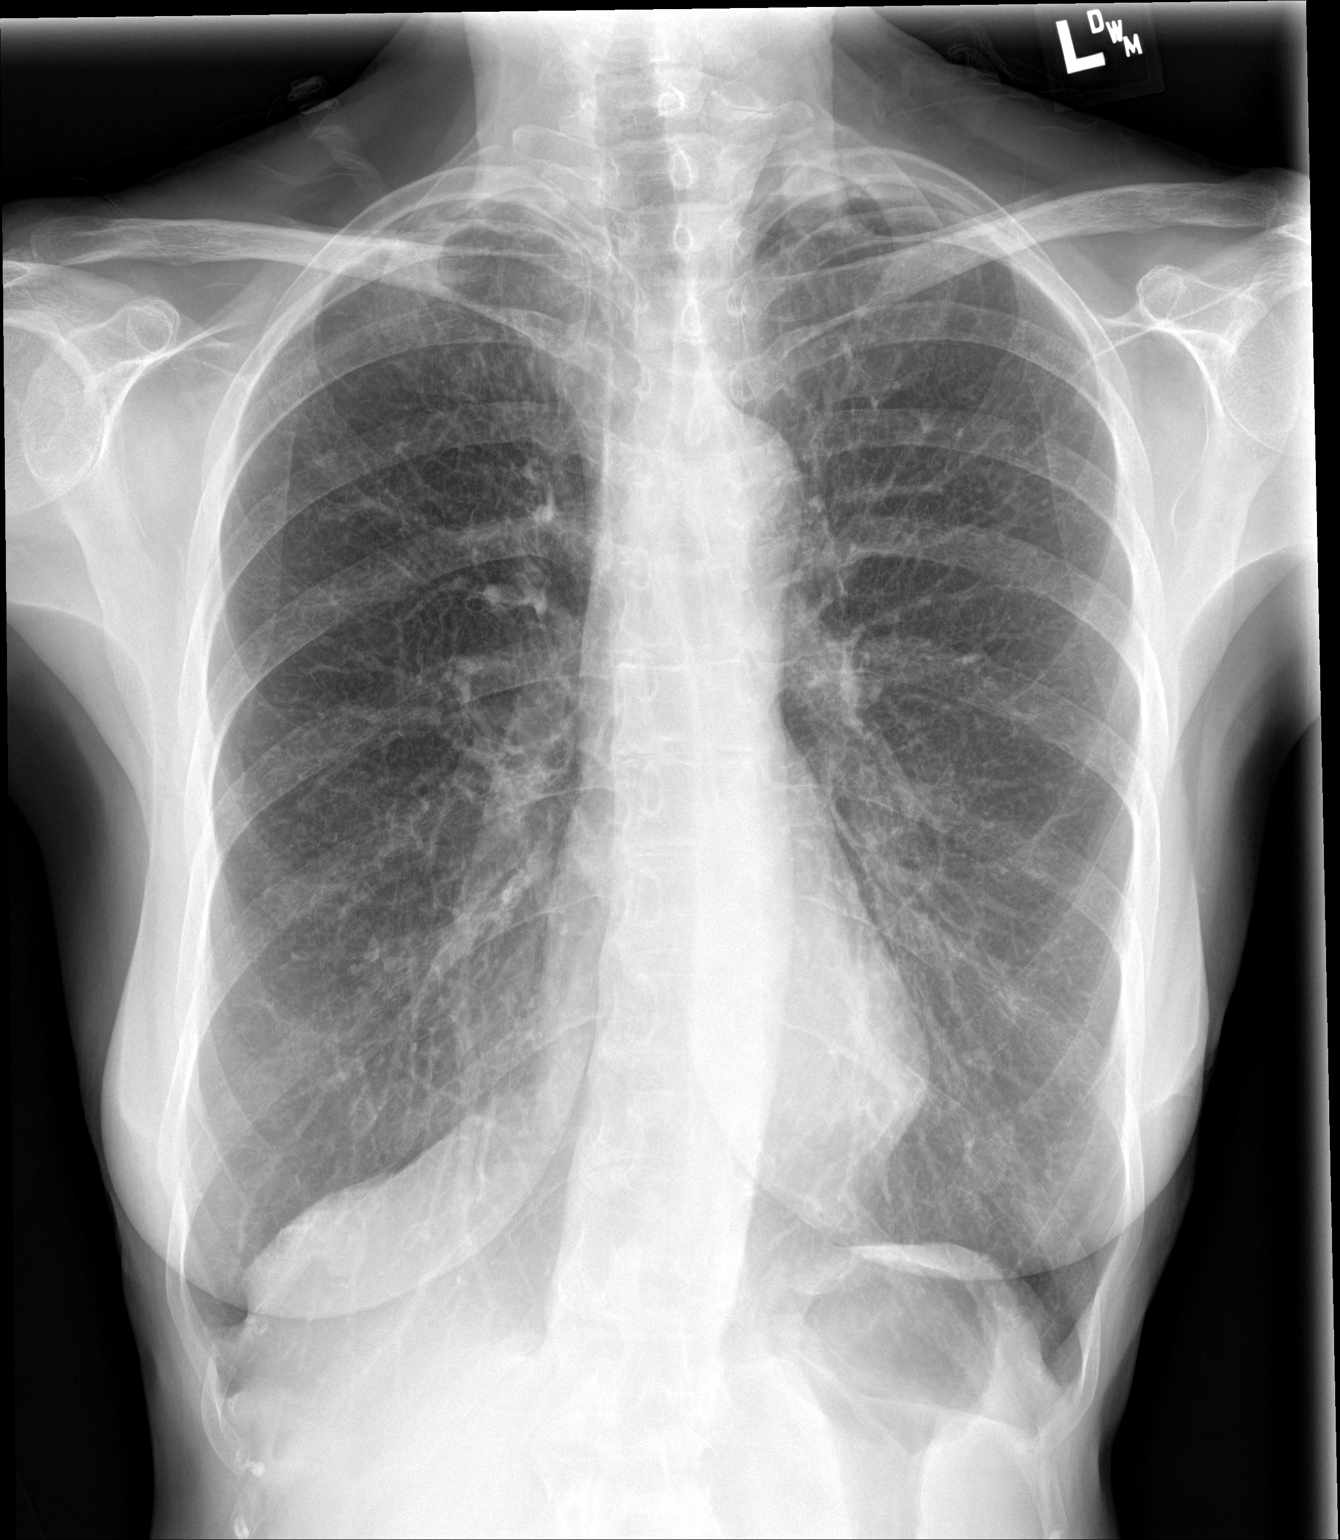

[chest lat]
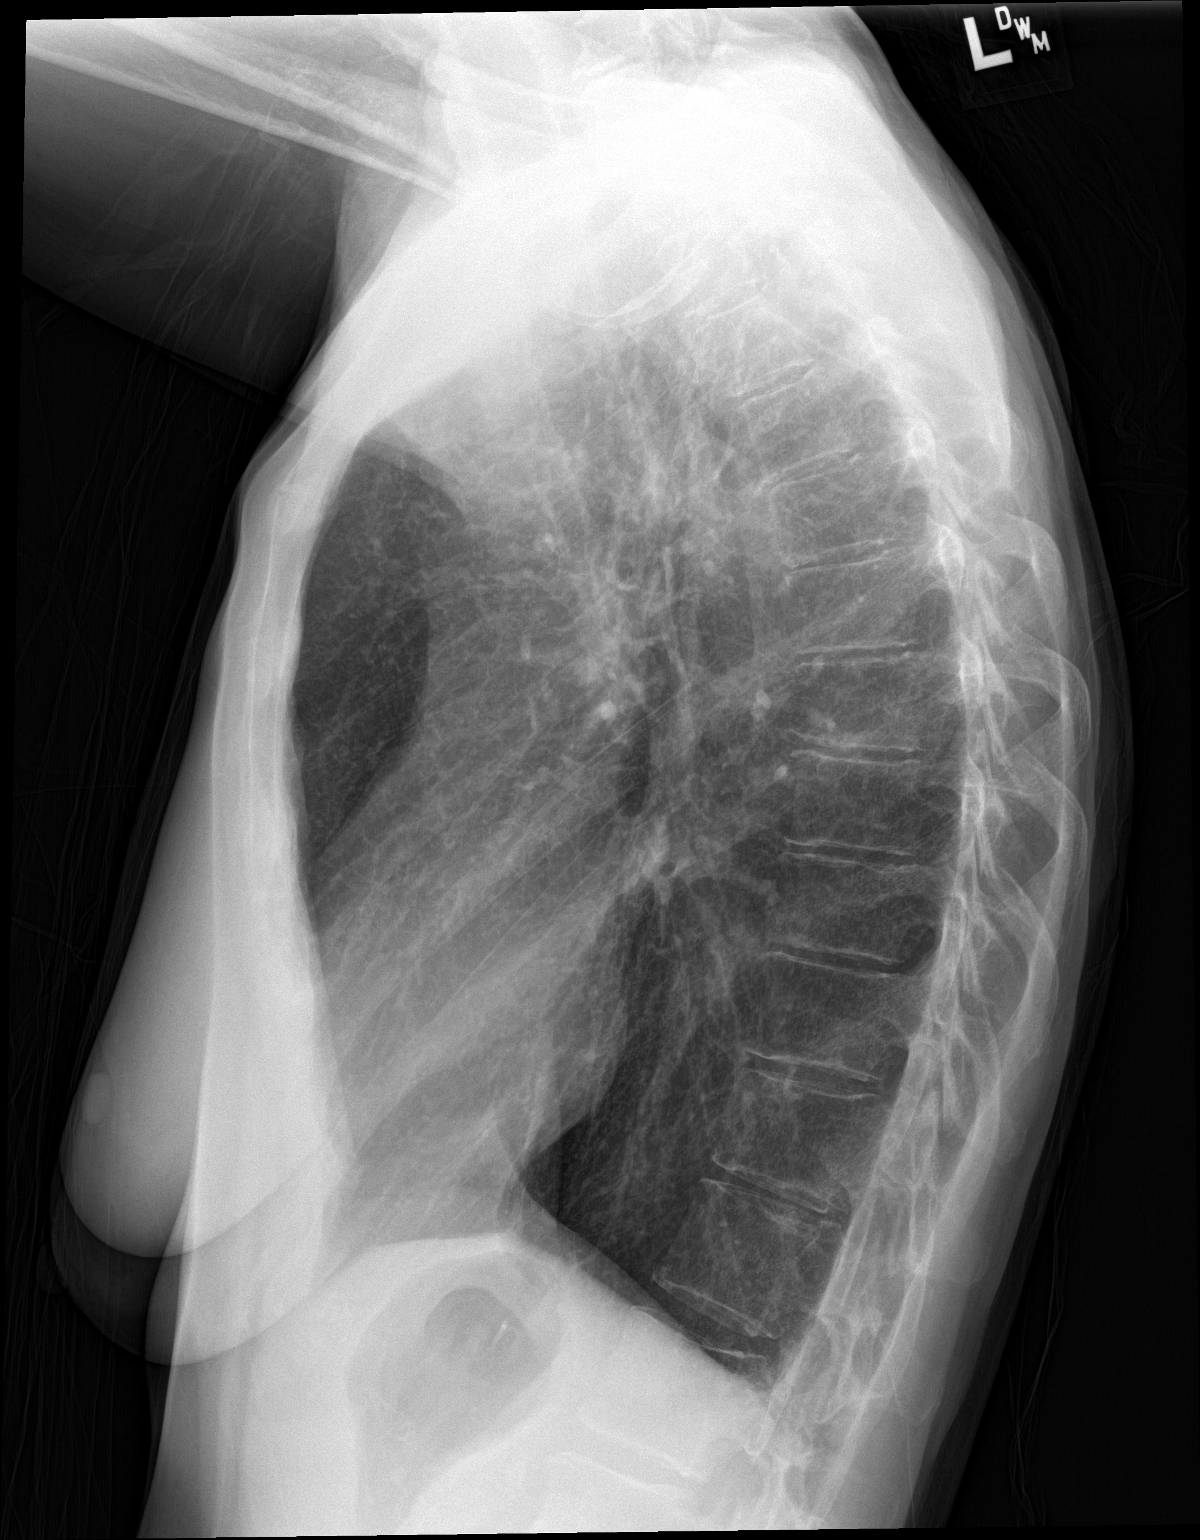

[2 of 2 positions shown; findings below may reference images not displayed]

FINDINGS: Hyperinflation consistent with COPD. Heart size and vascular pattern
are normal. No infiltrate or effusion.
IMPRESSION: COPD with no acute findings
